# Patient Record
Sex: Female | Born: 1970 | Race: White | Hispanic: No | Marital: Married | Smoking: Never smoker
Health system: Southern US, Community
[De-identification: ages and names within clinical notes are randomized; demographics above are authoritative.]

## PROBLEM LIST (undated history)

## (undated) DIAGNOSIS — M1990 Unspecified osteoarthritis, unspecified site: Secondary | ICD-10-CM

## (undated) DIAGNOSIS — F419 Anxiety disorder, unspecified: Secondary | ICD-10-CM

## (undated) HISTORY — PX: ABDOMINAL HYSTERECTOMY: SHX81

## (undated) HISTORY — DX: Unspecified osteoarthritis, unspecified site: M19.90

## (undated) HISTORY — DX: Anxiety disorder, unspecified: F41.9

---

## 1998-05-03 ENCOUNTER — Emergency Department (HOSPITAL_COMMUNITY): Admission: EM | Admit: 1998-05-03 | Discharge: 1998-05-04 | Payer: Self-pay

## 1998-11-19 ENCOUNTER — Other Ambulatory Visit: Admission: RE | Admit: 1998-11-19 | Discharge: 1998-11-19 | Payer: Self-pay | Admitting: Obstetrics and Gynecology

## 1998-12-20 ENCOUNTER — Other Ambulatory Visit: Admission: RE | Admit: 1998-12-20 | Discharge: 1998-12-20 | Payer: Self-pay | Admitting: Obstetrics and Gynecology

## 1999-03-29 ENCOUNTER — Other Ambulatory Visit: Admission: RE | Admit: 1999-03-29 | Discharge: 1999-03-29 | Payer: Self-pay | Admitting: Obstetrics and Gynecology

## 1999-05-19 ENCOUNTER — Other Ambulatory Visit: Admission: RE | Admit: 1999-05-19 | Discharge: 1999-05-19 | Payer: Self-pay | Admitting: Obstetrics and Gynecology

## 1999-08-18 ENCOUNTER — Other Ambulatory Visit: Admission: RE | Admit: 1999-08-18 | Discharge: 1999-08-18 | Payer: Self-pay | Admitting: Obstetrics and Gynecology

## 2000-02-22 ENCOUNTER — Other Ambulatory Visit: Admission: RE | Admit: 2000-02-22 | Discharge: 2000-02-22 | Payer: Self-pay | Admitting: Obstetrics and Gynecology

## 2000-02-23 ENCOUNTER — Other Ambulatory Visit: Admission: RE | Admit: 2000-02-23 | Discharge: 2000-02-23 | Payer: Self-pay | Admitting: Obstetrics and Gynecology

## 2000-08-06 ENCOUNTER — Other Ambulatory Visit: Admission: RE | Admit: 2000-08-06 | Discharge: 2000-08-06 | Payer: Self-pay | Admitting: Obstetrics and Gynecology

## 2000-11-22 ENCOUNTER — Other Ambulatory Visit: Admission: RE | Admit: 2000-11-22 | Discharge: 2000-11-22 | Payer: Self-pay | Admitting: Obstetrics and Gynecology

## 2000-11-30 ENCOUNTER — Inpatient Hospital Stay (HOSPITAL_COMMUNITY): Admission: AD | Admit: 2000-11-30 | Discharge: 2000-11-30 | Payer: Self-pay | Admitting: Obstetrics and Gynecology

## 2001-04-03 ENCOUNTER — Inpatient Hospital Stay (HOSPITAL_COMMUNITY): Admission: AD | Admit: 2001-04-03 | Discharge: 2001-04-03 | Payer: Self-pay | Admitting: Obstetrics and Gynecology

## 2001-04-04 ENCOUNTER — Inpatient Hospital Stay (HOSPITAL_COMMUNITY): Admission: AD | Admit: 2001-04-04 | Discharge: 2001-04-04 | Payer: Self-pay | Admitting: Obstetrics & Gynecology

## 2001-05-02 ENCOUNTER — Observation Stay (HOSPITAL_COMMUNITY): Admission: AD | Admit: 2001-05-02 | Discharge: 2001-05-03 | Payer: Self-pay | Admitting: Obstetrics and Gynecology

## 2001-05-03 ENCOUNTER — Encounter: Payer: Self-pay | Admitting: Obstetrics and Gynecology

## 2001-05-05 ENCOUNTER — Inpatient Hospital Stay (HOSPITAL_COMMUNITY): Admission: AD | Admit: 2001-05-05 | Discharge: 2001-05-05 | Payer: Self-pay | Admitting: Obstetrics and Gynecology

## 2001-05-05 ENCOUNTER — Encounter: Payer: Self-pay | Admitting: Obstetrics and Gynecology

## 2001-05-05 ENCOUNTER — Inpatient Hospital Stay (HOSPITAL_COMMUNITY): Admission: AD | Admit: 2001-05-05 | Discharge: 2001-05-08 | Payer: Self-pay | Admitting: Obstetrics and Gynecology

## 2001-06-21 ENCOUNTER — Other Ambulatory Visit: Admission: RE | Admit: 2001-06-21 | Discharge: 2001-06-21 | Payer: Self-pay | Admitting: Obstetrics and Gynecology

## 2002-04-01 ENCOUNTER — Encounter: Payer: Self-pay | Admitting: Obstetrics & Gynecology

## 2002-04-01 ENCOUNTER — Encounter: Admission: RE | Admit: 2002-04-01 | Discharge: 2002-04-01 | Payer: Self-pay | Admitting: Obstetrics & Gynecology

## 2002-07-08 ENCOUNTER — Encounter: Payer: Self-pay | Admitting: *Deleted

## 2002-07-08 ENCOUNTER — Encounter: Admission: RE | Admit: 2002-07-08 | Discharge: 2002-07-08 | Payer: Self-pay | Admitting: *Deleted

## 2002-07-16 ENCOUNTER — Encounter: Payer: Self-pay | Admitting: Internal Medicine

## 2002-07-16 ENCOUNTER — Encounter: Admission: RE | Admit: 2002-07-16 | Discharge: 2002-07-16 | Payer: Self-pay | Admitting: Internal Medicine

## 2002-07-17 ENCOUNTER — Other Ambulatory Visit: Admission: RE | Admit: 2002-07-17 | Discharge: 2002-07-17 | Payer: Self-pay | Admitting: Obstetrics & Gynecology

## 2003-05-19 ENCOUNTER — Emergency Department (HOSPITAL_COMMUNITY): Admission: EM | Admit: 2003-05-19 | Discharge: 2003-05-19 | Payer: Self-pay | Admitting: Emergency Medicine

## 2003-05-19 ENCOUNTER — Encounter: Payer: Self-pay | Admitting: Emergency Medicine

## 2003-07-16 ENCOUNTER — Other Ambulatory Visit: Admission: RE | Admit: 2003-07-16 | Discharge: 2003-07-16 | Payer: Self-pay | Admitting: Obstetrics and Gynecology

## 2004-03-29 ENCOUNTER — Observation Stay (HOSPITAL_COMMUNITY): Admission: AD | Admit: 2004-03-29 | Discharge: 2004-03-30 | Payer: Self-pay | Admitting: Obstetrics and Gynecology

## 2015-04-30 ENCOUNTER — Ambulatory Visit (INDEPENDENT_AMBULATORY_CARE_PROVIDER_SITE_OTHER): Payer: No Typology Code available for payment source

## 2015-04-30 ENCOUNTER — Ambulatory Visit (INDEPENDENT_AMBULATORY_CARE_PROVIDER_SITE_OTHER): Payer: No Typology Code available for payment source | Admitting: Family Medicine

## 2015-04-30 VITALS — BP 128/93 | HR 59 | Temp 98.3°F | Resp 16 | Ht 70.0 in | Wt 185.8 lb

## 2015-04-30 DIAGNOSIS — S9031XA Contusion of right foot, initial encounter: Secondary | ICD-10-CM

## 2015-04-30 DIAGNOSIS — M79671 Pain in right foot: Secondary | ICD-10-CM | POA: Diagnosis not present

## 2015-04-30 MED ORDER — HYDROCODONE-ACETAMINOPHEN 5-325 MG PO TABS: 1.0000 | ORAL_TABLET | Freq: Three times a day (TID) | ORAL | Status: DC | PRN

## 2015-04-30 NOTE — Progress Notes (Signed)
[facility] [address], [place] [address] [phone]  Date:  04/30/2015   Name:  Amy Walters   DOB:  Oct 23, 1970   MRN:  [id number]  PCP:  No PCP Per Patient    Chief Complaint: Foot Pain   History of Present Illness:  Amy Walters is a 44 y.o. very pleasant female patient who presents with the following:  She lives in [place]- visiting here to help her dad who is ill.  She broke the rigth 4th MT in April of this year.  This healed well and she was doing ok with her foot  Today is Friday- on Monday she accidentally kicked a door when she tripped over her dog.  Noted recurrent pain and also bruising in the distal foot.   The pain has increased over the last few days.  She became worried and decided to get an x-ray Her husband is an OBG and her orthopedist lives next door to her back home- they both encouraged her to be seen She is using her fracture boot since her injury and it does help her to walk  She is OW in good health She is waking up at night with the pain in her foot, using naproxen.  However naproxen is not controlling her pain She takes ambien at night chronically She recently used vicodin 5mg  for a hysterectomy and did well with this- reports that she did take this and her Remus Loffler and did fine She requests a supply of this medication for pain There are no active problems to display for this patient.   Past Medical History  Diagnosis Date  . Anxiety   . Arthritis     Past Surgical History  Procedure Laterality Date  . Abdominal hysterectomy      History  Substance Use Topics  . Smoking status: Never Smoker   . Smokeless tobacco: Not on file  . Alcohol Use: 0.0 oz/week    0 Standard drinks or equivalent per week    Family History  Problem Relation Age of Onset  . Hypertension Mother   . Stroke Mother   . Diabetes Father   . Hyperlipidemia Father   . Cancer Father     Melanoma  . Cancer Brother     No Known  Allergies  Medication list has been reviewed and updated.  No current outpatient prescriptions on file prior to visit.   No current facility-administered medications on file prior to visit.    Review of Systems:  As per HPI- otherwise negative.   Physical Examination: Filed Vitals:   04/30/15 1556  BP: 128/93  Pulse: 59  Temp: 98.3 F (36.8 C)  Resp: 16   Filed Vitals:   04/30/15 1556  Height: 5\' 10"  (1.778 m)  Weight: 185 lb 12.8 oz (84.278 kg)   Body mass index is 26.66 kg/(m^2). Ideal Body Weight: Weight in (lb) to have BMI = 25: 173.9  GEN: WDWN, NAD, Non-toxic, A & O x 3, looks well HEENT: Atraumatic, Normocephalic. Neck supple. No masses, No LAD. Ears and Nose: No external deformity. CV: RRR, No M/G/R. No JVD. No thrill. No extra heart sounds. PULM: CTA B, no wheezes, crackles, rhonchi. No retractions. No resp. distress. No accessory muscle use. EXTR: No c/c/e NEURO favoring right foot PSYCH: Normally interactive. Conversant. Not depressed or anxious appearing.  Calm demeanor.  Right foot: tenderness and bruising along the 3rd/ 4th/5th toes and distal metatarsals.  No swelling or heat, no  wound.   Ankle and shin negative  [facility] reading (PRIMARY) by  Dr. Patsy Lager. Right foot: negative  RIGHT FOOT COMPLETE - 3+ VIEW  COMPARISON: None.  FINDINGS: No fracture or dislocation of mid foot or forefoot. The phalanges are normal. The calcaneus is normal. No soft tissue abnormality.  IMPRESSION: No fracture or dislocation.  Assessment and Plan: Pain in right foot - Plan: DG Foot Complete Right, HYDROcodone-acetaminophen (NORCO/VICODIN) 5-325 MG per tablet  Contusion of right foot, initial encounter - Plan: HYDROcodone-acetaminophen (NORCO/VICODIN) 5-325 MG per tablet  Reassured that her foot seems to be contused but not broken.  Continue boot as needed for comfort Gave an rx for vicodin but recommended that she not combine this with ambien due to risk of  excessive sedation.    Signed Abbe Amsterdam, MD

## 2015-04-30 NOTE — Patient Instructions (Signed)
Use the pain medication as needed for your foot Let us know if this is not helping you feel better Use the walking boot as needed for comfort I will let you know if the x-ray report shows any thing of concern I hope that your foot feels better soon and that all works out with your dad's care plan

## 2015-05-06 ENCOUNTER — Telehealth: Payer: Self-pay

## 2015-05-06 NOTE — Telephone Encounter (Signed)
Pt returned Dr.Coplands call to PLEASE call her back TODAY if she can because she will be very busy tomorrow. She's flying out of town and tomorrow and is hoping she will get back by 5pm but she is not sure if she will make it.  Please advise [phone]

## 2015-05-06 NOTE — Telephone Encounter (Signed)
Left VM for pt to call back. Left in VM that Dr. Patsy Lager would like to see pt again.

## 2015-05-06 NOTE — Telephone Encounter (Signed)
Pt called in and states that Dr Patsy Lager told her to call if she was needing more medication due to her staying in town to take care of her father, and she is staying for a week and a half longer. So she needs more medicine. She can be reached @ [phone] Thank you

## 2015-05-06 NOTE — Telephone Encounter (Signed)
Called her again- no answer.  Left another message- I am concerned that if she needs narcotics we need to see her foot again. I apologize for any inconvenience, but I cannot call in hydrocodone, a hard copy is required.  In the meantime she may certainly use ibuprofen or tylenol as needed

## 2015-05-06 NOTE — Telephone Encounter (Signed)
Called and LMOM- if she still needs narcotic level pain relief we should re-xray her foot, as there was an area that looked suspicious to me.  I will be glad to see her tomorrow anytime, or if the situation is more urgent please come in today for recheck

## 2015-05-06 NOTE — Telephone Encounter (Signed)
Hydrocodone. Please advise.

## 2015-05-10 ENCOUNTER — Ambulatory Visit (INDEPENDENT_AMBULATORY_CARE_PROVIDER_SITE_OTHER): Payer: No Typology Code available for payment source

## 2015-05-10 ENCOUNTER — Ambulatory Visit (INDEPENDENT_AMBULATORY_CARE_PROVIDER_SITE_OTHER): Payer: No Typology Code available for payment source | Admitting: Family Medicine

## 2015-05-10 VITALS — BP 126/80 | HR 76 | Temp 98.1°F | Resp 16 | Ht 70.0 in | Wt 184.0 lb

## 2015-05-10 DIAGNOSIS — S9031XA Contusion of right foot, initial encounter: Secondary | ICD-10-CM | POA: Diagnosis not present

## 2015-05-10 DIAGNOSIS — M79671 Pain in right foot: Secondary | ICD-10-CM

## 2015-05-10 MED ORDER — HYDROCODONE-ACETAMINOPHEN 5-325 MG PO TABS: 1.0000 | ORAL_TABLET | Freq: Three times a day (TID) | ORAL | Status: AC | PRN

## 2015-05-10 NOTE — Progress Notes (Signed)
[facility] [address] [place] [address] [phone]  Date:  05/10/2015   Name:  Amy Walters   DOB:  [birth year]   MRN:  [id number]  PCP:  No PCP Per Patient    Chief Complaint: Follow-up   History of Present Illness:  Amy Walters is a 44 y.o. very pleasant female patient who presents with the following:  Here today to recheck foot pain- she was here 10 days ago with pain in her right foot and she tripped and accidentally kicked a hard object5.  Her x-rays were read as negative, but we suspected occult fracture as she continued to have severe pain.  She comes in today for recheck and possible repeat x-rays.  She is using a tall CAM boot and crutches that she had on hand.  Admits that she just made a business trip to [facility] and was walking quite a bit while she was there.  She makes her home in [place] with her family, but is spending some time in [place] as her father who lives here is sick  She used the hydrocodone that she was given at her last visit, and would like some more as her pain often keeps her up at night  There are no active problems to display for this patient.   Past Medical History  Diagnosis Date  . Anxiety   . Arthritis     Past Surgical History  Procedure Laterality Date  . Abdominal hysterectomy      History  Substance Use Topics  . Smoking status: Never Smoker   . Smokeless tobacco: Not on file  . Alcohol Use: 0.0 oz/week    0 Standard drinks or equivalent per week    Family History  Problem Relation Age of Onset  . Hypertension Mother   . Stroke Mother   . Diabetes Father   . Hyperlipidemia Father   . Cancer Father     Melanoma  . Cancer Brother     No Known Allergies  Medication list has been reviewed and updated.  Current Outpatient Prescriptions on File Prior to Visit  Medication Sig Dispense Refill  . sertraline (ZOLOFT) 100 MG tablet Take 100 mg by mouth daily.    Marland Kitchen zolpidem (AMBIEN) 10 MG tablet Take 10 mg  by mouth at bedtime as needed for sleep.    Marland Kitchen HYDROcodone-acetaminophen (NORCO/VICODIN) 5-325 MG per tablet Take 1 tablet by mouth every 8 (eight) hours as needed. (Patient not taking: Reported on 05/10/2015) 20 tablet 0   No current facility-administered medications on file prior to visit.    Review of Systems:  As per HPI- otherwise negative.   Physical Examination: Filed Vitals:   05/10/15 1549  BP: 126/80  Pulse: 76  Temp: 98.1 F (36.7 C)  Resp: 16   Filed Vitals:   05/10/15 1549  Height:  (1.778 m)  Weight: 184 lb (83.462 kg)   Body mass index is 26.4 kg/(m^2). Ideal Body Weight: Weight in (lb) to have BMI = 25: 173.9   GEN: WDWN, NAD, Non-toxic, Alert & Oriented x 3 HEENT: Atraumatic, Normocephalic.  Ears and Nose: No external deformity. EXTR: No clubbing/cyanosis/edema NEURO: favoring right, wearing CAM boot. She has crutches to use as needed PSYCH: Normally interactive. Conversant. Not depressed or anxious appearing.  Calm demeanor.  Right foot: she has tenderness and bruising along the distal foot, over the 3rd/4th/5th metatarsals. Tenderness is worst over the distal 3rd MT.  Foot is NV  intact, no swelling at this time.  Ankle is negative  [facility] reading (PRIMARY) by  Dr. Patsy Lager. Right ankle: negative Right foot: fracture of proximal phalanx of 5th toe  RIGHT FOOT COMPLETE - 3+ VIEW  COMPARISON: No priors.  FINDINGS: Transverse fracture of the proximal metaphysis of the fifth proximal phalanx, with overlying soft tissue swelling.  IMPRESSION: 1. Transverse nondisplaced fracture through the base of the fifth proximal phalanx.  RIGHT ANKLE - COMPLETE 3+ VIEW  COMPARISON: Right foot 04/30/2015  FINDINGS: There is no evidence of fracture, dislocation, or joint effusion. There is a well corticated bony fragment adjacent to the proximal dorsal aspect of the navicular likely reflecting sequela of prior injury. There is no evidence of arthropathy or  other focal bone abnormality. Soft tissues are unremarkable.  IMPRESSION: No acute osseous injury of the right ankle.  Assessment and Plan: Right foot pain - Plan: DG Foot Complete Right, DG Ankle Complete Right  Pain in right foot - Plan: HYDROcodone-acetaminophen (NORCO/VICODIN) 5-325 MG per tablet  Contusion of right foot, initial encounter - Plan: HYDROcodone-acetaminophen (NORCO/VICODIN) 5-325 MG per tablet  Buddy taped 4th and 5th toes for 5th toe fracture She will continue to use CAM boot and crutches She plans to see her orthopedist in [place] next week- may need MRI to rule-out any other occult fracture if she continues to have a lot of pain Did refill her hydrocodone for pain  Signed Abbe Amsterdam, MD

## 2015-05-10 NOTE — Patient Instructions (Signed)
Good to see you again today.   Continue to take pain medications as needed.   I would buddy tape the 5th toe to the 4th toe for the next couple of weeks The only fracture I can see is in the base bone of the little toe, but I will let you know if the radiologist says anything else

## 2015-06-15 ENCOUNTER — Ambulatory Visit
Admission: RE | Admit: 2015-06-15 | Discharge: 2015-06-15 | Disposition: A | Discharge status: Home / Self Care | Payer: No Typology Code available for payment source | Source: Ambulatory Visit | Attending: Internal Medicine | Admitting: Internal Medicine

## 2015-06-15 ENCOUNTER — Other Ambulatory Visit: Payer: Self-pay | Admitting: Internal Medicine

## 2015-06-15 DIAGNOSIS — R05 Cough: Secondary | ICD-10-CM

## 2015-06-15 DIAGNOSIS — R059 Cough, unspecified: Secondary | ICD-10-CM

## 2015-10-31 IMAGING — CR DG ANKLE COMPLETE 3+V*R*
4 series · 4 of 4 positions shown · non-contrast
Comparison: Right foot 04/30/2015

CLINICAL DATA: Foot pain, ankle pain

EXAM:
RIGHT ANKLE - COMPLETE 3+ VIEW

[AP]
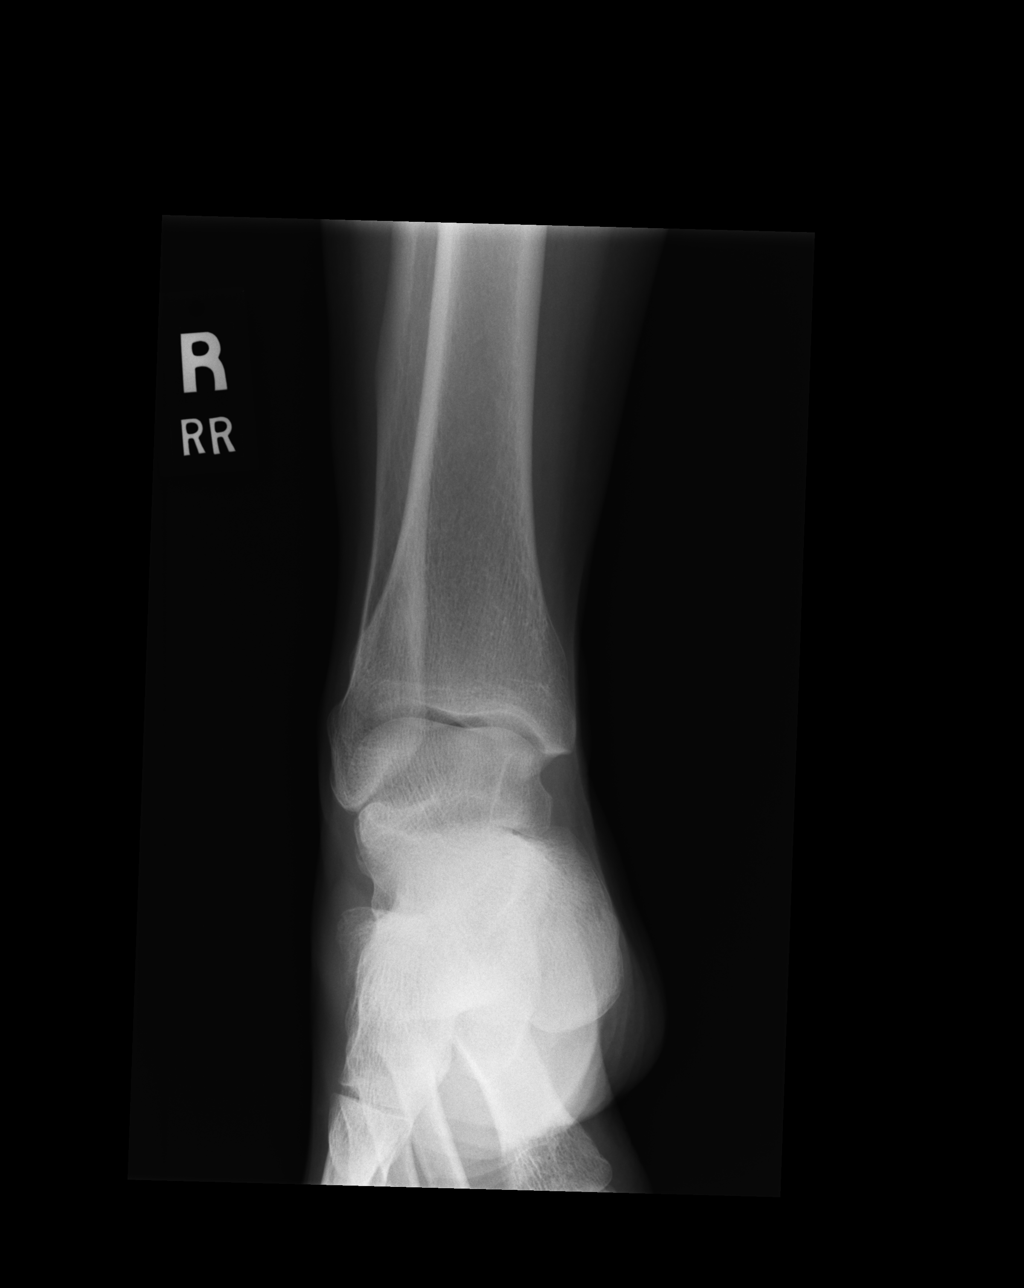

[ap obl int rot]
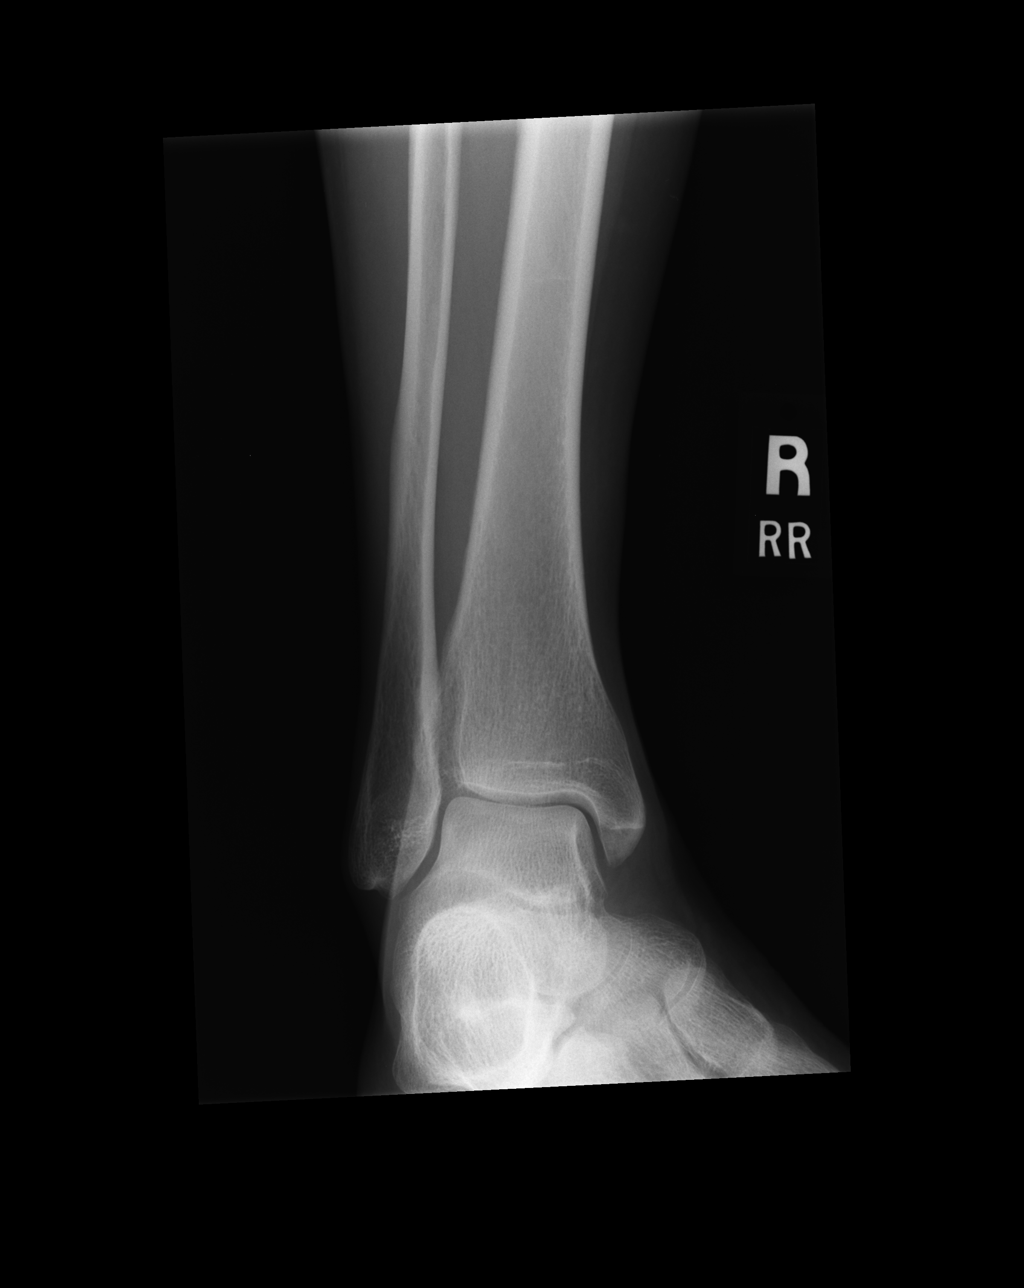

[ap obl ext rot]
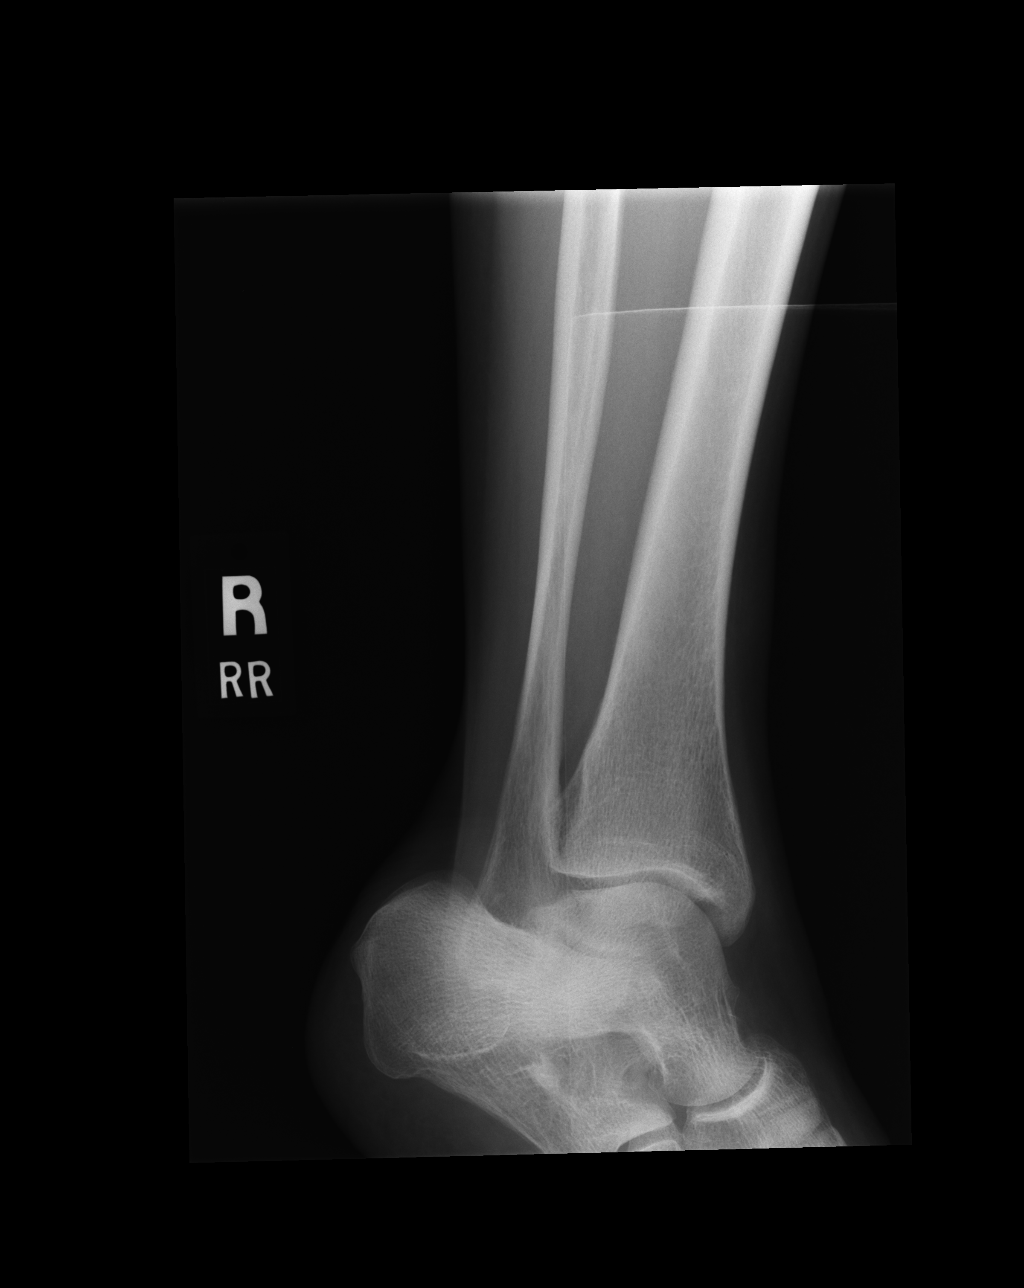

[lateral]
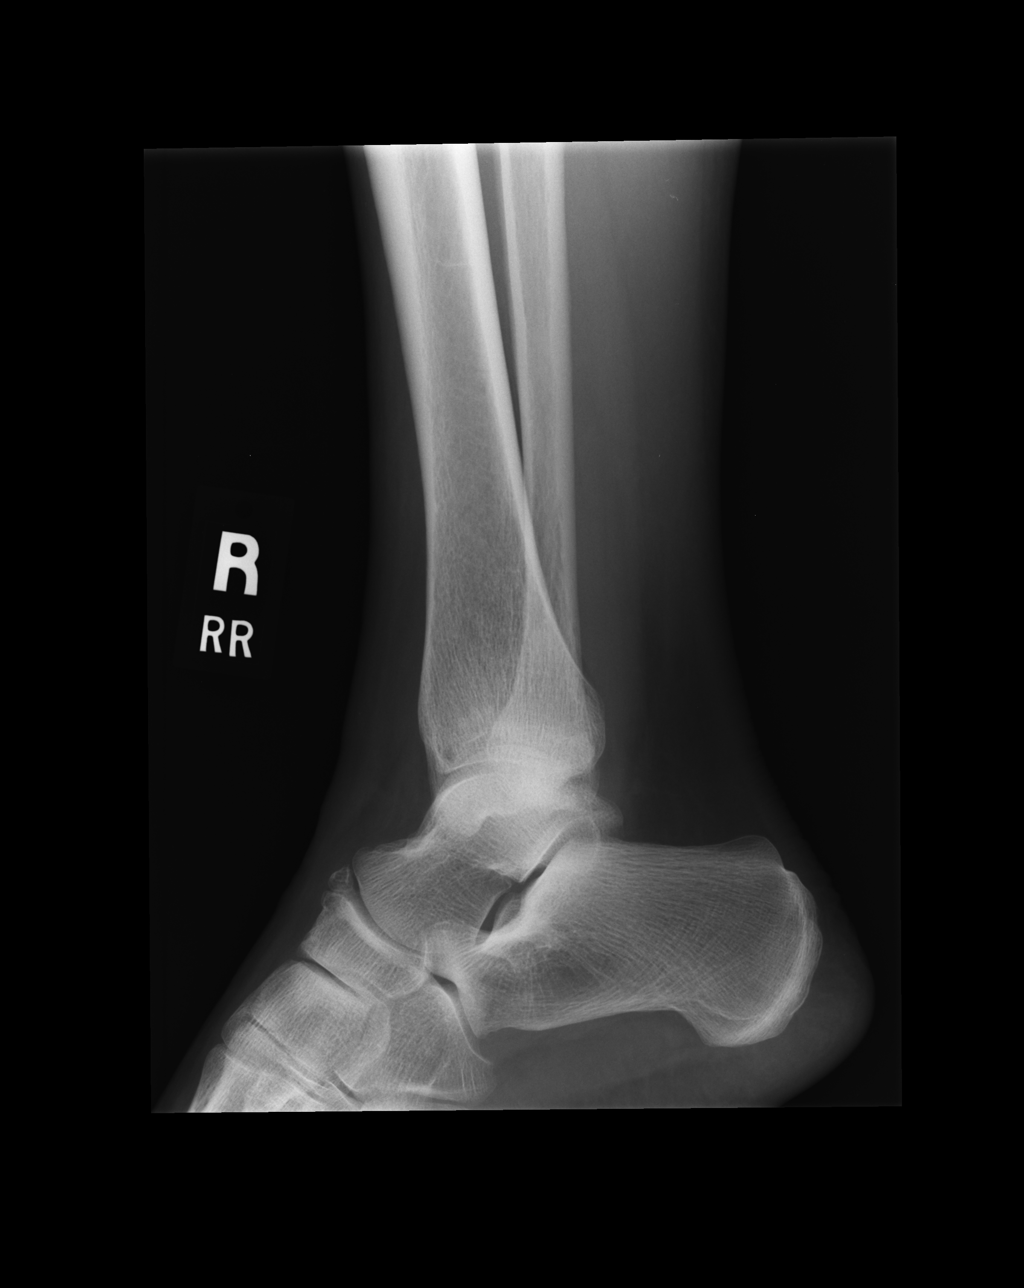

[4 of 4 positions shown; findings below may reference images not displayed]

FINDINGS: There is no evidence of fracture, dislocation, or joint effusion.
There is a well corticated bony fragment adjacent to the proximal
dorsal aspect of the navicular likely reflecting sequela of prior
injury. There is no evidence of arthropathy or other focal bone
abnormality. Soft tissues are unremarkable.
IMPRESSION: No acute osseous injury of the right ankle.

## 2021-10-20 ENCOUNTER — Encounter (INDEPENDENT_AMBULATORY_CARE_PROVIDER_SITE_OTHER): Payer: BLUE CROSS/BLUE SHIELD | Admitting: Internal Medicine

## 2021-10-20 ENCOUNTER — Telehealth (INDEPENDENT_AMBULATORY_CARE_PROVIDER_SITE_OTHER): Payer: Self-pay

## 2021-10-20 NOTE — Telephone Encounter (Signed)
Called patient to reschedule appointment into a telemedicine travel consult.

## 2021-10-21 ENCOUNTER — Telehealth (INDEPENDENT_AMBULATORY_CARE_PROVIDER_SITE_OTHER): Payer: Self-pay

## 2021-10-21 ENCOUNTER — Telehealth (INDEPENDENT_AMBULATORY_CARE_PROVIDER_SITE_OTHER): Payer: BLUE CROSS/BLUE SHIELD | Admitting: Internal Medicine

## 2021-10-21 DIAGNOSIS — Z23 Encounter for immunization: Secondary | ICD-10-CM

## 2021-10-21 DIAGNOSIS — K5100 Ulcerative (chronic) pancolitis without complications: Secondary | ICD-10-CM | POA: Insufficient documentation

## 2021-10-21 DIAGNOSIS — Z7184 Encounter for health counseling related to travel: Secondary | ICD-10-CM

## 2021-10-21 MED ORDER — JAPANESE ENCEPHALITIS VAC INAC IM SUSP
0.5000 mL | Freq: Once | INTRAMUSCULAR | Status: AC
Start: 2021-10-31 — End: 2021-10-25
  Administered 2021-10-25: 0.5 mL via INTRAMUSCULAR

## 2021-10-21 MED ORDER — JAPANESE ENCEPHALITIS VAC INAC IM SUSP
0.5000 mL | Freq: Once | INTRAMUSCULAR | Status: AC
Start: 2021-10-24 — End: 2021-11-02
  Administered 2021-11-02: 0.5 mL via INTRAMUSCULAR

## 2021-10-21 MED ORDER — TYPHOID VI POLYSACCHARIDE VACC 25 MCG/0.5ML IM SOSY
0.5000 mL | PREFILLED_SYRINGE | Freq: Once | INTRAMUSCULAR | Status: AC
Start: 2021-10-21 — End: 2021-10-25
  Administered 2021-10-25: 0.5 mL via INTRAMUSCULAR

## 2021-10-21 MED ORDER — HEPATITIS A VACCINE 1440 EL U/ML IM SUSY
1440.0000 [IU] | PREFILLED_SYRINGE | Freq: Once | INTRAMUSCULAR | Status: AC
Start: 2021-11-01 — End: 2021-10-25
  Administered 2021-10-25: 1440 [IU] via INTRAMUSCULAR

## 2021-10-21 MED ORDER — AZITHROMYCIN 500 MG OR TABS
500.0000 mg | ORAL_TABLET | Freq: Once | ORAL | 0 refills | Status: AC | PRN
Start: 2021-10-21 — End: ?

## 2021-10-21 NOTE — Progress Notes (Signed)
[facility]     Pre-Travel Consultation    Distant Site Telemedicine Encounter      I conducted this encounter via secure, live, face-to-face video conference with the patient. I reviewed the risks and benefits of telemedicine as pertinent to this visit and the patient agreed to proceed.    Provider Location: On-site location (clinic, hospital, on-site office)  Patient Location: At home  Present with patient: No one else present     Itinerary  (Including stops, in order of trip): [place], [place] on back roads biking trip only going to [employer] and [employer]    Dates of travel: 10-12 days total  Traveling with spouse    Currently feeling at baseline health with no active complaints.  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    OUTPATIENT MEDICATIONS:  No outpatient medications prior to visit.     No facility-administered medications prior to visit.       ALLERGIES:  Review of patient's allergies indicates:  Not on File      REVIEW OF SYSTEMS:  A complete review of systems was performed and was otherwise negative unless noted in the HPI or Interval History section.    PHYSICAL EXAM:  There were no vitals taken for this visit.  Pleasant, well developed, well nourished in NAD  Sclera anicteric  Easy work of breathing on room air, symmetric expansion  Normal bulk and tone  A&O x 3, speech fluent  Appropriate mood, normal affect  No rashes on face or hands    Assessment and plan    (Z71.89) Travel advice encounter  (primary encounter diagnosis)  -Vaccine record reviewed. For all  immunizations ordered, risks, benefits, and potential side effects were reviewed with patient and VIS was given. Recommended & ordered the following:    -   Orders Placed This Encounter    azithromycin 500 MG tablet     Sig: Take 1 tablet (500 mg) by mouth one time as needed (for severe travellers diarrhea.) for up to 1 dose.     Dispense:  2 tablet     Refill:  0     Extra dose for travel    japanese encephalitis vaccine (Ixiaro) injection 0.5 mL    japanese encephalitis vaccine (Ixiaro) injection 0.5 mL    typhoid Vi polysaccharide vaccine (Typhim VI) injection 0.5 mL    hepatitis A adult vaccine (Havrix) injection 1,440 units       Malaria prophylaxis and insect prevention  Discussed strategies for prevention of mosquito-born illnesses and appropriate use of insect repellant  Malaria map reviewed with patient and copy given.  Prescription for malaria prophylaxis: Not indicated  Number of days in malaria risk area: 0    Diarrhea  Food and water precautions discussed in detail. Discussed appropriate use of anti-diarrheals, antibiotic use, and indication to seek urgent medical care.   Appropriate antibiotic for travel destination prescribed (see orders:)  -Azithromycin 500mg  PO x 1 days prescribed PRN diarrhea while traveling.  -Imodium is cautioned,  but can be of use before a long plane/train/car ride.    Other travel advice:  Discussed general guidelines for less developed country travel related to personal safety and hygiene, passport and valuable control, vehicular traffic, sun protection, footwear., Discussed jet lag management options, Discussed rabies vaccine risks and benefits, prevention of bites from mammals, risks if bitten, and need for treatment:  declines vaccine today, understands risk of contracting disease. and Discussed risks related to aquatic activites including risks for drowning, riptides, jellyfish stings, schistosomiasis and leptospirosis exposure     Benefits, risks, and potential side  effects of all prescribed medications were discussed in detail.     Medical evaluation during or after travel   Advised patient to seek urgent medical attention for unexplained fever, chronic cough, weight loss, fatigue, abdominal pain, vomiting, chronic diarrhea, and/or jaundice during travel or upon return from travel.     I spent 30 minutes face to face with the patient counseling and/or discussing risk factor reduction as outlined in the note above

## 2021-10-21 NOTE — Telephone Encounter (Signed)
Patient called, scheduled appt for travel vaccinations

## 2021-10-25 ENCOUNTER — Ambulatory Visit (INDEPENDENT_AMBULATORY_CARE_PROVIDER_SITE_OTHER): Payer: BLUE CROSS/BLUE SHIELD | Admitting: Unknown Physician Specialty

## 2021-10-25 DIAGNOSIS — Z7184 Encounter for health counseling related to travel: Secondary | ICD-10-CM

## 2021-10-25 DIAGNOSIS — Z23 Encounter for immunization: Secondary | ICD-10-CM

## 2021-10-25 NOTE — Progress Notes (Signed)
Vaccine Screening Questions    Interpreter: No    1. Are you allergic to Latex? NO    2.  Have you had a serious reaction or an allergic reaction to a vaccine?  NO    3.  Currently have a moderate or severe illness, including fever?  NO    4.  Ever had a seizure or any neurological problem associated with a vaccine? (DTaP/TDaP/DTP pertinent) NO    5.  Is patient receiving any live vaccinations today? (Varicella-Chickenpox, MMR-Measles/Mumps/Rubella, Zoster-Shingles, Flumist, Yellow Fever) NOTE: oral rotavirus is exempt  YES - Additional Live Vaccine Questions:                       Have you received a live vaccine in the last 4 weeks?  NO    For women:  Are you pregnant or is there a chance you could become pregnant during the next month?  NO    Are you or any members of your household immune suppressed (steroid treatment, AIDS, cancer)?   NO    During the past year, have you received a transfusion of blood or blood products, or been given immune gamma globulin or an antiviral drug? NO    If YES to any of the questions above - Do NOT give vaccine.  Consult with RN or provider in clinic.  (#5 can be YES if all Live vaccine questions are answered NO)    If NO to all questions above - Patient may receive vaccine.    6. Are you pregnant or is there a chance you could become pregnant during the next month (HPV pertinent) NO    7. Do you need to receive the Flu vaccine today? NO    All patients are encouraged to wait 15 minutes before leaving after receiving any vaccine.    VIS given 09/04/2022 by Elizette Shek V [place], CMA.

## 2021-11-01 ENCOUNTER — Ambulatory Visit (INDEPENDENT_AMBULATORY_CARE_PROVIDER_SITE_OTHER): Payer: BLUE CROSS/BLUE SHIELD

## 2021-11-01 ENCOUNTER — Telehealth (INDEPENDENT_AMBULATORY_CARE_PROVIDER_SITE_OTHER): Payer: Self-pay

## 2021-11-01 NOTE — Telephone Encounter (Signed)
Patient called to reschedule immunization appt for tomorrow (2/1)

## 2021-11-02 ENCOUNTER — Ambulatory Visit (INDEPENDENT_AMBULATORY_CARE_PROVIDER_SITE_OTHER): Payer: BLUE CROSS/BLUE SHIELD | Admitting: Unknown Physician Specialty

## 2021-11-02 DIAGNOSIS — Z23 Encounter for immunization: Secondary | ICD-10-CM

## 2021-11-02 DIAGNOSIS — Z7184 Encounter for health counseling related to travel: Secondary | ICD-10-CM

## 2021-11-02 NOTE — Progress Notes (Signed)
Vaccine Screening Questions    Interpreter: No    1. Are you allergic to Latex? NO    2.  Have you had a serious reaction or an allergic reaction to a vaccine?  NO    3.  Currently have a moderate or severe illness, including fever?  NO    4.  Ever had a seizure or any neurological problem associated with a vaccine? (DTaP/TDaP/DTP pertinent) NO    5.  Is patient receiving any live vaccinations today? (Varicella-Chickenpox, MMR-Measles/Mumps/Rubella, Zoster-Shingles, Flumist, Yellow Fever) NOTE: oral rotavirus is exempt  NO    If YES to any of the questions above - Do NOT give vaccine.  Consult with RN or provider in clinic.  (#5 can be YES if all Live vaccine questions are answered NO)    If NO to all questions above - Patient may receive vaccine.    6. Are you pregnant or is there a chance you could become pregnant during the next month (HPV pertinent) NO    7. Do you need to receive the Flu vaccine today? NO      All patients are encouraged to wait 15 minutes before leaving after receiving any vaccine.    VIS given 11/02/2021 by Godfrey Pick, CMA.

## 2023-05-16 ENCOUNTER — Other Ambulatory Visit (INDEPENDENT_AMBULATORY_CARE_PROVIDER_SITE_OTHER): Payer: Self-pay | Admitting: Internal Medicine

## 2023-07-24 ENCOUNTER — Telehealth (INDEPENDENT_AMBULATORY_CARE_PROVIDER_SITE_OTHER): Payer: BLUE CROSS/BLUE SHIELD | Admitting: Internal Medicine
